# Patient Record
Sex: Female | Born: 1990 | Race: White | Hispanic: No | Marital: Single | Smoking: Current every day smoker
Health system: Southern US, Community
[De-identification: ages and names within clinical notes are randomized; demographics above are authoritative.]

## PROBLEM LIST (undated history)

## (undated) DIAGNOSIS — J45909 Unspecified asthma, uncomplicated: Secondary | ICD-10-CM

## (undated) HISTORY — PX: TONSILLECTOMY: SUR1361

## (undated) HISTORY — DX: Unspecified asthma, uncomplicated: J45.909

---
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
## 2010-05-09 ENCOUNTER — Encounter: Payer: Self-pay | Admitting: Obstetrics and Gynecology

## 2011-10-29 ENCOUNTER — Telehealth (HOSPITAL_COMMUNITY): Payer: Self-pay | Admitting: *Deleted

## 2011-10-29 ENCOUNTER — Encounter (HOSPITAL_COMMUNITY): Payer: Self-pay | Admitting: *Deleted

## 2011-10-29 ENCOUNTER — Inpatient Hospital Stay (HOSPITAL_COMMUNITY)
Admission: AD | Admit: 2011-10-29 | Discharge: 2011-11-01 | DRG: 885 | Disposition: A | Discharge status: Home / Self Care | Payer: Medicaid Other | Source: Ambulatory Visit | Attending: Psychiatry | Admitting: Psychiatry

## 2011-10-29 DIAGNOSIS — F3130 Bipolar disorder, current episode depressed, mild or moderate severity, unspecified: Secondary | ICD-10-CM

## 2011-10-29 DIAGNOSIS — J45909 Unspecified asthma, uncomplicated: Secondary | ICD-10-CM | POA: Diagnosis present

## 2011-10-29 DIAGNOSIS — F1210 Cannabis abuse, uncomplicated: Secondary | ICD-10-CM | POA: Diagnosis present

## 2011-10-29 DIAGNOSIS — F339 Major depressive disorder, recurrent, unspecified: Principal | ICD-10-CM | POA: Diagnosis present

## 2011-10-29 DIAGNOSIS — F609 Personality disorder, unspecified: Secondary | ICD-10-CM | POA: Diagnosis present

## 2011-10-29 DIAGNOSIS — F17200 Nicotine dependence, unspecified, uncomplicated: Secondary | ICD-10-CM | POA: Diagnosis present

## 2011-10-29 DIAGNOSIS — F6089 Other specific personality disorders: Secondary | ICD-10-CM | POA: Diagnosis present

## 2011-10-29 MED ORDER — NICOTINE 21 MG/24HR TD PT24
21.0000 mg | MEDICATED_PATCH | Freq: Every day | TRANSDERMAL | Status: DC
  Administered 2011-10-29 – 2011-10-31 (×2): 21 mg via TRANSDERMAL
  Filled 2011-10-29 (×7): qty 1

## 2011-10-29 MED ORDER — ACETAMINOPHEN 325 MG PO TABS
650.0000 mg | ORAL_TABLET | Freq: Four times a day (QID) | ORAL | Status: DC | PRN
  Administered 2011-10-31: 650 mg via ORAL

## 2011-10-29 MED ORDER — MAGNESIUM HYDROXIDE 400 MG/5ML PO SUSP: 30.0000 mL | Freq: Every day | ORAL | Status: DC | PRN

## 2011-10-29 MED ORDER — ALUM & MAG HYDROXIDE-SIMETH 200-200-20 MG/5ML PO SUSP: 30.0000 mL | ORAL | Status: DC | PRN

## 2011-10-29 MED ORDER — QUETIAPINE FUMARATE 25 MG PO TABS
25.0000 mg | ORAL_TABLET | ORAL | Status: DC | PRN
  Administered 2011-10-29 – 2011-10-31 (×3): 25 mg via ORAL
  Filled 2011-10-29 (×3): qty 1

## 2011-10-29 NOTE — Tx Team (Signed)
Initial Interdisciplinary Treatment Plan  PATIENT STRENGTHS: (choose at least two) Average or above average intelligence Motivation for treatment/growth Supportive family/friends Work skills  PATIENT STRESSORS: Loss of father and 3 close friends *   PROBLEM LIST: Problem List/Patient Goals Date to be addressed Date deferred Reason deferred Estimated date of resolution  Suicide ideation  10-29-11           Hx of bipolar d/o ; depressed 10-29-11           Multiple losses  10-29-11                              DISCHARGE CRITERIA:  Improved stabilization in mood, thinking, and/or behavior Reduction of life-threatening or endangering symptoms to within safe limits  PRELIMINARY DISCHARGE PLAN: Attend aftercare/continuing care group Return to previous living arrangement Return to previous work or school arrangements  PATIENT/FAMIILY INVOLVEMENT: This treatment plan has been presented to and reviewed with the patient, Felicia Richardson, and/or family member, .  The patient and family have been given the opportunity to ask questions and make suggestions.  Valente David 10/29/2011, 5:12 PM

## 2011-10-29 NOTE — Progress Notes (Signed)
[facility] Group Notes:  (Counselor/Nursing/MHT/Case Management/Adjunct)  10/29/2011 11:40 PM  Type of Therapy:  Psychoeducational Skills  Participation Level:  Minimal  Participation Quality:  Resistant  Affect:  Depressed  Cognitive:  Appropriate  Insight:  Limited  Engagement in Group:  Limited  Engagement in Therapy:  Limited  Modes of Intervention:  Education  Summary of Progress/Problems:The pt. was tearful at times during group. She had very little to say except that she was sad. No goal was stated.    Hazle Coca S 10/29/2011, 11:40 PM

## 2011-10-29 NOTE — Progress Notes (Signed)
Patient ID: Felicia Richardson, female   DOB: 1990-12-18, 21 y.o.   MRN: [id number] 10-29-11 nursing admission note: pt came to [facility] involuntarily from [facility] with an admitting dx of bipolar d/o nos. She was referred to [facility] by [facility] under the ivc. Pt endorses worsening depression for the past couple years. She attributes this to the death of her father and three close friends. She was having suicidal thoughts with a plan to overdose. On admission she was able to contract for suicide verbally, and denies any hi/av. She had a suicide attempt in the past by overdosing on tylenol. This is her first hospitalization in a psych facility. She has been seen at [facility] on an outpt basis in the past. She is non compliant with her medications for the last 3 months. She is having custody issues with her child. She stated she has 2 children, both boys and is single. She admits to daily use of mj with a uds positive for mj. She stated she drinks etoh socially x2 monthly. She has a medical hx of asthma, x2  c-secs and tonsillectomy.  She has a negative preg test, negative rpr, cbc= wnl and cmet=wnl. She is a lesbian and been in a 8 month relationship.  Pt was escorted to the 500 hall and report was given to Heron Nay.   Emergency contact : Eliese salazar-mother at cell ph # [phone]
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
## 2011-10-30 LAB — TSH: TSH: 3.818 u[IU]/mL (ref 0.350–4.500)
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
## 2011-10-30 NOTE — Progress Notes (Signed)
[facility] Group Notes:  (Counselor/Nursing/MHT/Case Management/Adjunct)  10/30/2011 2:25 PM  Type of Therapy:  Psychoeducational Skills  Participation Level:  Active  Participation Quality:  Appropriate and Sharing  Affect:  Appropriate  Cognitive:  Appropriate  Insight:  Good  Engagement in Group:  Good  Engagement in Therapy:  Good  Modes of Intervention:  Education  Summary of Progress/Problems: STAFF PROVIDED PATIENT WITH A PASSAGE ENTITLED "ENDING THE CYCLE: START TODAY". THE PATIENT WAS INSTRUCTED TO READ THE PASSAGE. STAFF GENERATED QUESTIONS FOR THE PATIENT TO ANSWER  AFTER READING THE PASSAGE: WHAT DID YOU GET FROM THE PASSAGE AND WHAT WS ONE POSITIVE LESSON LEARNED FROM THE PASSAGE? [name] WAS ENGAGED IN THE GROUP AND DISCUSSED THAT SHE WANTS TO STOP SMOKING THC. PATIENT ALSO READ THE PASSAGE AND EXPLAINED WHAT IT MEANT TO HER. STAFF ENCOURAGED [name] TO THINK POSITIVE ABOUT HER SITUATION AND TO FOCUS ON THINGS THAT SHE CAN CHANGE NOW. PATIENT IS ENCOURAGED TO UTILIZE COPING SKILLS TO ASSIST HER WITH HER MENTAL ILLNESS AND OTHER ISSUES THAT SHE IS DEALING WITH.     [name] O 10/30/2011, 2:25 PM
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
## 2011-10-30 NOTE — H&P (Signed)
Psychiatric Admission Assessment Adult  Patient Identification:  Felicia Richardson Date of Evaluation:  10/30/2011 20yo SF  On IVC CC: Suicidal ideation that comes and goes  `UDS+THC ` History of Present Illness: Suicidal with a plan to overdose on her meds. Called her advocate who brought her to theED. She is still actively grieving the deaths of her father and 3 friends. This is the first year anniversary. Patient is impulsive has had assault charges in past and currently has communicating threats charges.  One week ago presented for eval & treatment of depression ancxietySI poor concentration energy sadness sleep changes mood swings.  Past Psychiatric History: Age 59 admitted to [facility] for  behaviors and overdosed age 72 on Tylenol  Substance Abuse History:  Social History:    reports that she has been smoking Cigarettes.  She has a 7 pack-year smoking history. She has never used smokeless tobacco. She reports that she uses illicit drugs (Marijuana). She reports that she does not drink alcohol. UDS+THC Family Psych History: Father was "Environmental education officer" but this was after a head injury   Past Medical History:     Past Medical History  Diagnosis Date  . Asthma        Past Surgical History  Procedure Date  . Tonsillectomy     Allergies: No Known Allergies  Current Medications:  Prior to Admission medications   Not on File    Mental Status Examination/Evaluation: Objective:  Appearance: Fairly Groomed  Psychomotor Activity:  Increased  Eye Contact::  Fair  Speech:  Pressured  Volume:  Increased  Mood: anxiously depressed    Affect:  Labile  Thought Process: clear rational goal oriented    Orientation:  Full  Thought Content:  No AVH/psychosis   Suicidal Thoughts:  Yes.  without intent/plan  Homicidal Thoughts:  No  Judgement:  Poor  Insight:  Lacking    DIAGNOSIS:    AXIS I Mood Disorder NOS, Substance Abuse and Substance Induced Mood Disorder  AXIS II Cluster B  Traits  AXIS III See medical history.  AXIS IV economic problems, educational problems, occupational problems, other psychosocial or environmental problems, problems related to legal system/crime and problems with primary support group  AXIS V 31-40 impairment in reality testing     Treatment Plan Summary: Admit for safety & stabilization  Patient opposed to meds  Agree with H&P from [facility]

## 2011-10-30 NOTE — Progress Notes (Addendum)
Psychoeducational Group  10/30/2011  1015  Group Topic  :   Healthy Support System -  The focus of this group is to assist patient to identify their support systems .  Group Participation   :   Active  Participation Level:    Active  Participation Quality   : Appropriate, Sharing and supportive  Affect:      Appropriate and Flat  Cognitive:   Appropriate  Insight:     Minimal       PDuke RN [facility]

## 2011-10-30 NOTE — Progress Notes (Signed)
Patient ID: Felicia Richardson, female   DOB: [birth year], 21 y.o.   MRN: [id number]  Pt. attended and participated in aftercare planning group. Pt. accepted information on suicide prevention, warning signs to look for with suicide and crisis line numbers to use. The pt. agreed to call crisis line numbers if having warning signs or having thoughts of suicide. Pt. listed their current anxiety level as 0 and their current depression level as 0. Pt. did not have any questions or concerns regarding aftercare planning during group.

## 2011-10-30 NOTE — Progress Notes (Signed)
Patient ID: Felicia Richardson, female   DOB: Jan 21, 1991, 20 y.o.   MRN: [id number]   [facility] Group Notes:  (Counselor/Nursing/MHT/Case Management/Adjunct)  10/30/2011 1:15 PM  Type of Therapy:  Group Therapy, Dance/Movement Therapy   Participation Level:  Active  Participation Quality:  Monopolizing  Affect:  Appropriate  Cognitive:  Appropriate  Insight:  Limited  Engagement in Group:  Good  Engagement in Therapy:  Good  Modes of Intervention:  Clarification, Problem-solving, Role-play, Socialization and Support  Summary of Progress/Problems: Therapist and group members discussed what we need in a healthy support system and things we do not need. Therapist modeled a positive support and a negative support and group members discussed the differences. Pt. shared that she was feeling good today. Pt. was seeking attention from the group by bringing up an activity that she wanted to do several times during group. Pt. does not want to continue with medication and stated, "I feel like my mind is strong enough to use coping skills and hobbies instead of medication."      [name] 10/30/2011. 3:09 PM

## 2011-10-30 NOTE — Progress Notes (Signed)
Patient ID: Felicia Richardson, female   DOB: [birth year], 21 y.o.   MRN: [id number]  Problem: Suicidal, Depression  D: Pt pleasant and cooperative, out in milieu interacting well with peers. Pt with good hygiene and appetite  A: Monitor q 15 minutes for safety, encourage staff/peer interaction and group participation. Administer medications as ordered by MD. Encourage pt to share feelings.  R: Pt continued to socialize with peers, styling other's hair and laughing with peers. Pt attended group and became tearful, endorsing depression at times. Pt denies SI at this time and is compliant with medications. No inappropriate behaviors noted.

## 2011-10-31 DIAGNOSIS — F319 Bipolar disorder, unspecified: Secondary | ICD-10-CM

## 2011-10-31 NOTE — Progress Notes (Signed)
Currently resting quietly in bed with eyes closed. Respirations are even and unlabored. No acute distress or discomfort noted. Safety has been maintained with Q15 minute observation. Will continue current POC. 

## 2011-10-31 NOTE — Tx Team (Signed)
Interdisciplinary Treatment Plan Update (Adult)  Date:  10/31/2011  Time Reviewed:  10:33 AM   Progress in Treatment: Attending groups: Yes Participating in groups:  Yes Taking medication as prescribed: Yes Tolerating medication:  Yes Family/Significant other contact made:  Counselor will assess for appropriate contact Patient understands diagnosis:  Yes Discussing patient identified problems/goals with staff:  Yes Medical problems stabilized or resolved:  Yes Denies suicidal/homicidal ideation: Yes Issues/concerns per patient self-inventory:  None identified Other: N/A  New problem(s) identified: None Identified  Reason for Continuation of Hospitalization: Anxiety Depression Medication stabilization  Interventions implemented related to continuation of hospitalization: mood stabilization, medication monitoring and adjustment, group therapy and psycho education, safety checks q 15 mins  Additional comments: N/A  Estimated length of stay: 3-5 days  Discharge Plan:SW will assess for appropriate referrals.    New goal(s): N/A  Review of initial/current patient goals per problem list:    1.  Goal(s): Reduce depressive symptoms  Met:  No  Target date: by discharge  As evidenced by: Reducing depression from a 10 to a 3 as reported by pt.   2.  Goal (s): Reduce/Eliminate suicidal ideation  Met:  No  Target date: by discharge  As evidenced by: pt reporting no SI.    3.  Goal(s): Reduce anxiety symptoms  Met:  No  Target date: by discharge  As evidenced by: Reduce anxiety from a 10 to a 3 as reported by pt.    Attendees: Patient:  Felicia Richardson 10/31/2011 10:37 AM   Family:     Physician:     Nursing:   Chinita Greenland, RN 10/31/2011 10:36 AM   Case Manager:  Reyes Ivan, LCSWA 10/31/2011  10:33 AM   Counselor:  Angus Palms, LCSW 10/31/2011  10:33 AM   Other:  Juline Patch, LCSW 10/31/2011  10:33 AM   Other:  Serena Colonel, NP 10/31/2011 10:36 AM   Other:   Corinne Ports, Psyc intern 10/31/2011 10:36 AM   Other:      Scribe for Treatment Team:   Carmina Miller, 10/31/2011 , 10:33 AM
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
## 2011-10-31 NOTE — Progress Notes (Signed)
D:  Patient reports depression as 0/10 and hopelessness 0/10.  She states that she is going to groups and has no suicidal thoughts and would like to be discharged.  She became upset that no one would discharge her stating that no one cared about her kids. She was resistant to hearing any explanations for continuing to stay here.   A:   Emotional support provided.  Safety checks q 15 minutes.   R:  Patient continues to be upset with staff due to not being discharged.  Refuses all medications, stating that she wants to handle her life through coping mechanisms.

## 2011-10-31 NOTE — Progress Notes (Signed)
[facility] Adult Inpatient Family/Significant Other Suicide Prevention Education  Suicide Prevention Education:  Contact Attempts: Elandra Powell, mother, [phone]) has been identified by the patient as the family member/significant other who will aid the patient in the event of a mental health crisis.  With written consent from the patient, two attempts were made to provide suicide prevention education, prior to and/or following the patient's discharge.  We were unsuccessful in providing suicide prevention education.  A suicide education pamphlet was given to the patient to share with family/significant other.  Date and time of first attempt:10/31/2011   11:13am  Date and time of second attempt:10/31/2011   3:52 PM    Counselor also attempted to contact Alix's girlfriend, Nadene Rubins, at [phone] on 10/31/2011 At 3:53 PM and was unable to contact her.   Billie Lade 10/31/2011, 3:51 PM

## 2011-10-31 NOTE — Progress Notes (Signed)
[facility] Group Notes: (Counselor/Nursing/MHT/Case Management/Adjunct) 10/31/2011   @  1:15-2:30pm Overcoming Obstacles to Wellness   Type of Therapy:  Group Therapy  Participation Level:  Active  Participation Quality:  Intrusive, Sharing, Monopolizing, Resistant, Inappropriate  Affect:  Appropriate  Cognitive:  Appropriate  Insight:  Limited - statements were insightful into her situation but lacked insight into how she was impacting others  Engagement in Group: Limited  Engagement in Therapy:  Limited  Modes of Intervention:  Support and Exploration  Summary of Progress/Problems: Glorious made several insightful statements during group, talking about how one cannot forget past pains and mistakes but can move past them. However, she became a bit pushy, interrupting others to tell them what they should or have to do. She became frustrated when counselor pointed out to her that others may be in a very different space and although she was trying to help, telling them what to do may cause them to shut down or seem to invalidate their experiences. Mona left the group, then re-entered during relaxation exercise. She stayed only long enough to make an inappropriate statement toward counselor then left,laughing.   Angus Palms, LCSW 10/31/2011  3:30 PM
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
## 2011-10-31 NOTE — Progress Notes (Signed)
Psychoeducational Group Note  Date:  10/31/2011 Time:  1100  Group Topic/Focus:  Self Care:   The focus of this group is to help patients understand the importance of self-care in order to improve or restore emotional, physical, spiritual, interpersonal, and financial health.  Participation Level:  Active  Participation Quality:  Appropriate  Affect:  Excited  Cognitive:  Alert  Insight:  Good  Engagement in Group:  Good  Additional Comments:  none  [name] 10/31/2011, 6:33 PM

## 2011-10-31 NOTE — Progress Notes (Signed)
[facility] Group Notes:  (Counselor/Nursing/MHT/Case Management/Adjunct)  10/31/2011 12:24 AM  Type of Therapy:  Psychoeducational Skills  Participation Level:  Active  Participation Quality:  Monopolizing  Affect:  Anxious  Cognitive:  Appropriate  Insight:  Limited  Engagement in Group:  Limited  Engagement in Therapy:  Limited  Modes of Intervention:  Education  Summary of Progress/Problems: The pt. Verbalized that she had a good day overall as she took advantage of going outside. She did mention that she wants to be discharged tomorrow since she doesn't have reliable childcare. Pt. Had to be redirected for talking out of turn during the group.   Felicia Richardson S 10/31/2011, 12:24 AM

## 2011-11-01 DIAGNOSIS — F6089 Other specific personality disorders: Secondary | ICD-10-CM | POA: Diagnosis present

## 2011-11-01 DIAGNOSIS — F609 Personality disorder, unspecified: Secondary | ICD-10-CM

## 2011-11-01 DIAGNOSIS — F339 Major depressive disorder, recurrent, unspecified: Secondary | ICD-10-CM | POA: Diagnosis present

## 2011-11-01 NOTE — Progress Notes (Signed)
Currently resting quietly in bed with eyes closed. Respirations are even and unlabored. No acute distress or discomfort noted. Safety has been maintained with Q15 minute observation. Will continue current POC. 

## 2011-11-01 NOTE — Tx Team (Signed)
Interdisciplinary Treatment Plan Update (Adult)  Date:  11/01/2011  Time Reviewed:  10:12 AM   Progress in Treatment: Attending groups: Yes Participating in groups:  Yes Taking medication as prescribed: Yes Tolerating medication:  Yes Family/Significant other contact made:  Counselor will attempt to contact today.  Patient understands diagnosis:  Yes Discussing patient identified problems/goals with staff:  Yes Medical problems stabilized or resolved:  Yes Denies suicidal/homicidal ideation: Yes Issues/concerns per patient self-inventory:  None identified Other: N/A  New problem(s) identified: None Identified  Reason for Continuation of Hospitalization: Stable to d/c   Interventions implemented related to continuation of hospitalization: Stable to d/c  Additional comments: N/A  Estimated length of stay: D/C today  Discharge Plan: Pt will follow up with [facility] for medication management and therapy.   New goal(s): N/A  Review of initial/current patient goals per problem list:    1.  Goal(s): Reduce depressive symptoms  Met:  Yes  Target date: by discharge  As evidenced by: Reducing depression from a 10 to a 3 as reported by pt. Pt rates at a 0 today.   2.  Goal (s): Reduce/Eliminate suicidal ideation  Met:  Yes  Target date: by discharge  As evidenced by: pt denies SI.      3.  Goal(s): Reduce anxiety symptoms  Met:  Yes  Target date: by discharge  As evidenced by: Reduce anxiety from a 10 to a 3 as reported by pt. Pt rates at a 3 today.     Attendees: Patient:  Felicia Richardson 11/01/2011 10:16 AM   Family:     Physician:    Nursing:   Neill Loft, RN 11/01/2011 10:18 AM   Case Manager:  Reyes Ivan, LCSWA 11/01/2011  10:12 AM   Counselor:  Angus Palms, LCSW 11/01/2011  10:12 AM   Other:  Juline Patch, LCSW 11/01/2011  10:12 AM   Other: Barrie Folk, RN 11/01/2011 10:18 AM   Other:  Carney Living, RN 11/01/2011 10:18 AM     Other:      Scribe for Treatment Team:   Carmina Miller, 11/01/2011 , 10:12 AM

## 2011-11-01 NOTE — Progress Notes (Signed)
[facility] Adult Inpatient Family/Significant Other Suicide Prevention Education  Suicide Prevention Education:  Education Completed; Felicia Richardson, mother, (face to face) has been identified by the patient as the family member/significant other with whom the patient will be residing, and identified as the person(s) who will aid the patient in the event of a mental health crisis (suicidal ideations/suicide attempt).  With written consent from the patient, the family member/significant other has been provided the following suicide prevention education, prior to the and/or following the discharge of the patient.  The suicide prevention education provided includes the following:  Suicide risk factors  Suicide prevention and interventions  National Suicide Hotline telephone number  [facility] assessment telephone number  [facility] Emergency Assistance 911  [facility] and/or Residential Mobile Crisis Unit telephone number  Request made of family/significant other to:  Remove weapons (e.g., guns, rifles, knives), all items previously/currently identified as safety concern.    Remove drugs/medications (over-the-counter, prescriptions, illicit drugs), all items previously/currently identified as a safety concern.  Felicia Richardson reported that she has no concerns about her daughter being discharge. She denied patient having access to guns and verbalized understanding of suicide prevention information. Felicia Richardson had no further questions about patient's inpatient or follow up care.  Felicia Richardson 11/01/2011, 12:59 PM
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
## 2011-11-01 NOTE — Progress Notes (Signed)
[facility] Case Management Discharge Plan:  Will you be returning to the same living situation after discharge: Yes,  return to own home At discharge, do you have transportation home?:Yes,  mom will pick pt up Do you have the ability to pay for your medications:Yes,  access to meds  Release of information consent forms completed and in the chart;  Patient's signature needed at discharge.  Patient to Follow up at:  Follow-up Information    Follow up with [facility] on 11/03/2011. (Appointment scheduled at 3:30 pm)    Contact information:   [address] [place], [place] [address] [phone]         Patient denies SI/HI:   Yes,  denies SI/HI    Safety Planning and Suicide Prevention discussed:  Yes,  discussed with pt  Barrier to discharge identified:No.  Summary and Recommendations: Pt attended discharge planning group and actively participated in group.  SW provided pt with today's workbook.  Pt presents with calm mood and affect.  Pt rates depression at a 0 and anxiety at a 3 today.  Pt denies SI.  Pt reports feeling stable to d/c today.  No recommendations from SW.  No further needs voiced by pt.  Pt stable to discharge.     Carmina Miller 11/01/2011, 10:19 AM
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
## 2011-11-01 NOTE — Progress Notes (Signed)
Pt d/c from hospital with family members. All items returned. D/C instructions given. Pt denies si and hi.
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
## 2011-11-03 NOTE — Progress Notes (Signed)
Patient Discharge Instructions:  After Visit Summary (AVS):   Faxed to:  11/02/2011 Psychiatric Admission Assessment Note:   Faxed to:  11/02/2011 Suicide Risk Assessment - Discharge Assessment:   Faxed to:  11/02/2011 Faxed/Sent to the Next Level Care provider:  11/02/2011  Faxed to [facility] @ [phone]  Wandra Scot, 11/03/2011, 12:19 PM
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
## 2016-07-04 ENCOUNTER — Encounter (HOSPITAL_COMMUNITY): Payer: Self-pay | Admitting: *Deleted

## 2016-07-04 ENCOUNTER — Emergency Department (HOSPITAL_COMMUNITY)

## 2016-07-04 ENCOUNTER — Emergency Department (HOSPITAL_COMMUNITY)
Admission: EM | Admit: 2016-07-04 | Discharge: 2016-07-05 | Disposition: A | Discharge status: Home / Self Care | Attending: Dermatology | Admitting: Dermatology

## 2016-07-04 DIAGNOSIS — F17210 Nicotine dependence, cigarettes, uncomplicated: Secondary | ICD-10-CM | POA: Insufficient documentation

## 2016-07-04 DIAGNOSIS — R0602 Shortness of breath: Secondary | ICD-10-CM | POA: Diagnosis not present

## 2016-07-04 DIAGNOSIS — R000 Tachycardia, unspecified: Secondary | ICD-10-CM | POA: Insufficient documentation

## 2016-07-04 DIAGNOSIS — M549 Dorsalgia, unspecified: Secondary | ICD-10-CM | POA: Insufficient documentation

## 2016-07-04 DIAGNOSIS — Z5321 Procedure and treatment not carried out due to patient leaving prior to being seen by health care provider: Secondary | ICD-10-CM | POA: Insufficient documentation

## 2016-07-04 LAB — URINALYSIS, ROUTINE W REFLEX MICROSCOPIC
BILIRUBIN URINE: NEGATIVE
Glucose, UA: NEGATIVE mg/dL
Hgb urine dipstick: NEGATIVE
KETONES UR: NEGATIVE mg/dL
LEUKOCYTES UA: NEGATIVE
NITRITE: NEGATIVE
PH: 5 (ref 5.0–8.0)
Protein, ur: NEGATIVE mg/dL
Specific Gravity, Urine: 1.03 (ref 1.005–1.030)

## 2016-07-04 LAB — BASIC METABOLIC PANEL
Anion gap: 9 (ref 5–15)
BUN: 13 mg/dL (ref 6–20)
CALCIUM: 8.3 mg/dL — AB (ref 8.9–10.3)
CO2: 25 mmol/L (ref 22–32)
CREATININE: 0.88 mg/dL (ref 0.44–1.00)
Chloride: 105 mmol/L (ref 101–111)
GFR calc non Af Amer: >60 mL/min (ref >60)
Glucose, Bld: 118 mg/dL — ABNORMAL HIGH (ref 65–99)
Potassium: 4 mmol/L (ref 3.5–5.1)
SODIUM: 139 mmol/L (ref 135–145)

## 2016-07-04 LAB — BRAIN NATRIURETIC PEPTIDE: B NATRIURETIC PEPTIDE 5: 126 pg/mL — AB (ref 0.0–100.0)

## 2016-07-04 LAB — CBC
HCT: 44.9 % (ref 36.0–46.0)
Hemoglobin: 15.1 g/dL — ABNORMAL HIGH (ref 12.0–15.0)
MCH: 29.5 pg (ref 26.0–34.0)
MCHC: 33.6 g/dL (ref 30.0–36.0)
MCV: 87.7 fL (ref 78.0–100.0)
PLATELETS: 207 10*3/uL (ref 150–400)
RBC: 5.12 MIL/uL — AB (ref 3.87–5.11)
RDW: 12.6 % (ref 11.5–15.5)
WBC: 14.1 10*3/uL — ABNORMAL HIGH (ref 4.0–10.5)

## 2016-07-04 LAB — I-STAT TROPONIN, ED: Troponin i, poc: 0 ng/mL (ref 0.00–0.08)

## 2016-07-04 LAB — POC URINE PREG, ED: PREG TEST UR: NEGATIVE

## 2016-07-04 NOTE — ED Triage Notes (Signed)
Pt is here for pain in lower back x a couple of weeks.  Pt denies any trauma associated with this.  No pain or burning with urination.  Pt HR is 120's-130 in triage. Pt states that Felicia Richardson frequently has high HR (plasma center sometimes doesn't let her sell plasma due to to this). Pt gave plasma today.

## 2018-05-05 IMAGING — DX DG CHEST 2V
2 series · 2 of 2 positions shown · non-contrast
Comparison: Chest radiograph dated 04/11/2015

CLINICAL DATA: 25-year-old female with shortness of breath and
tachycardia.

EXAM:
CHEST  2 VIEW

[chest pa]
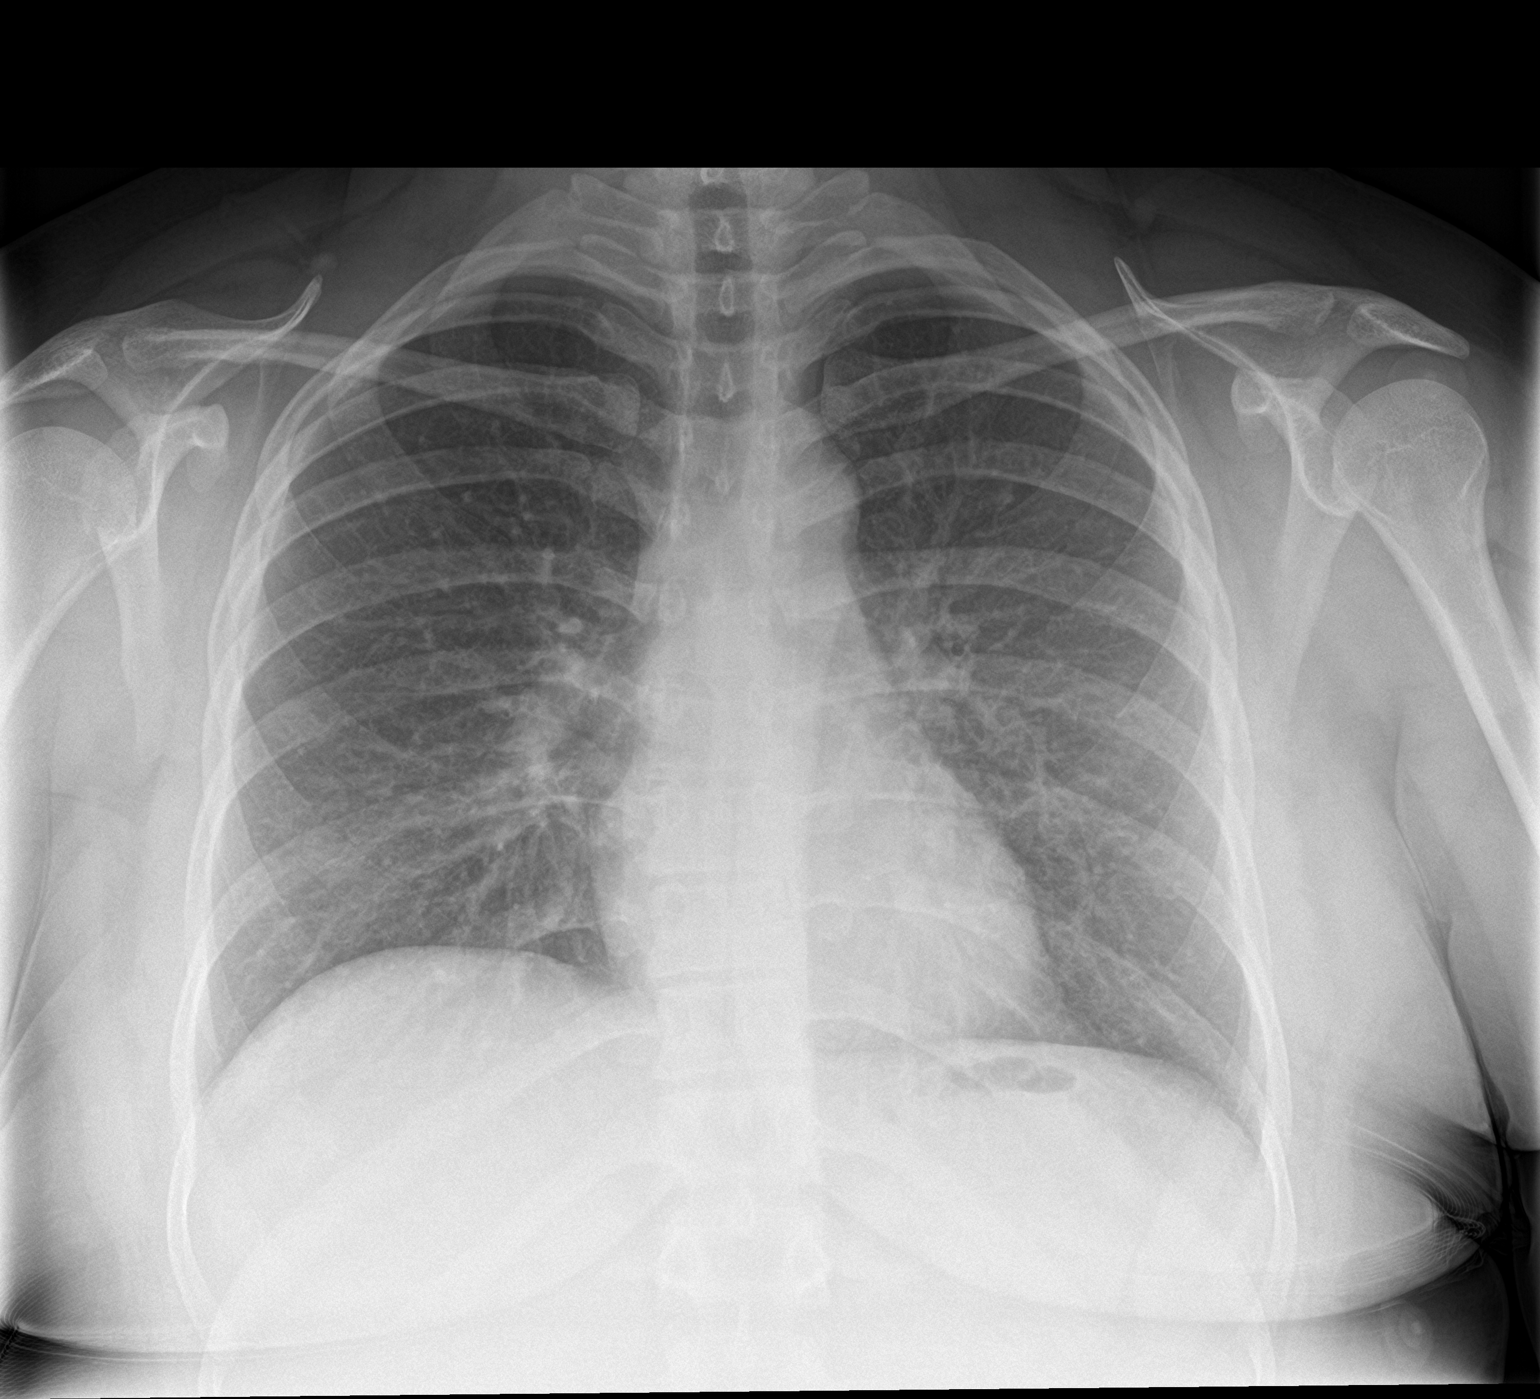

[chest lat]
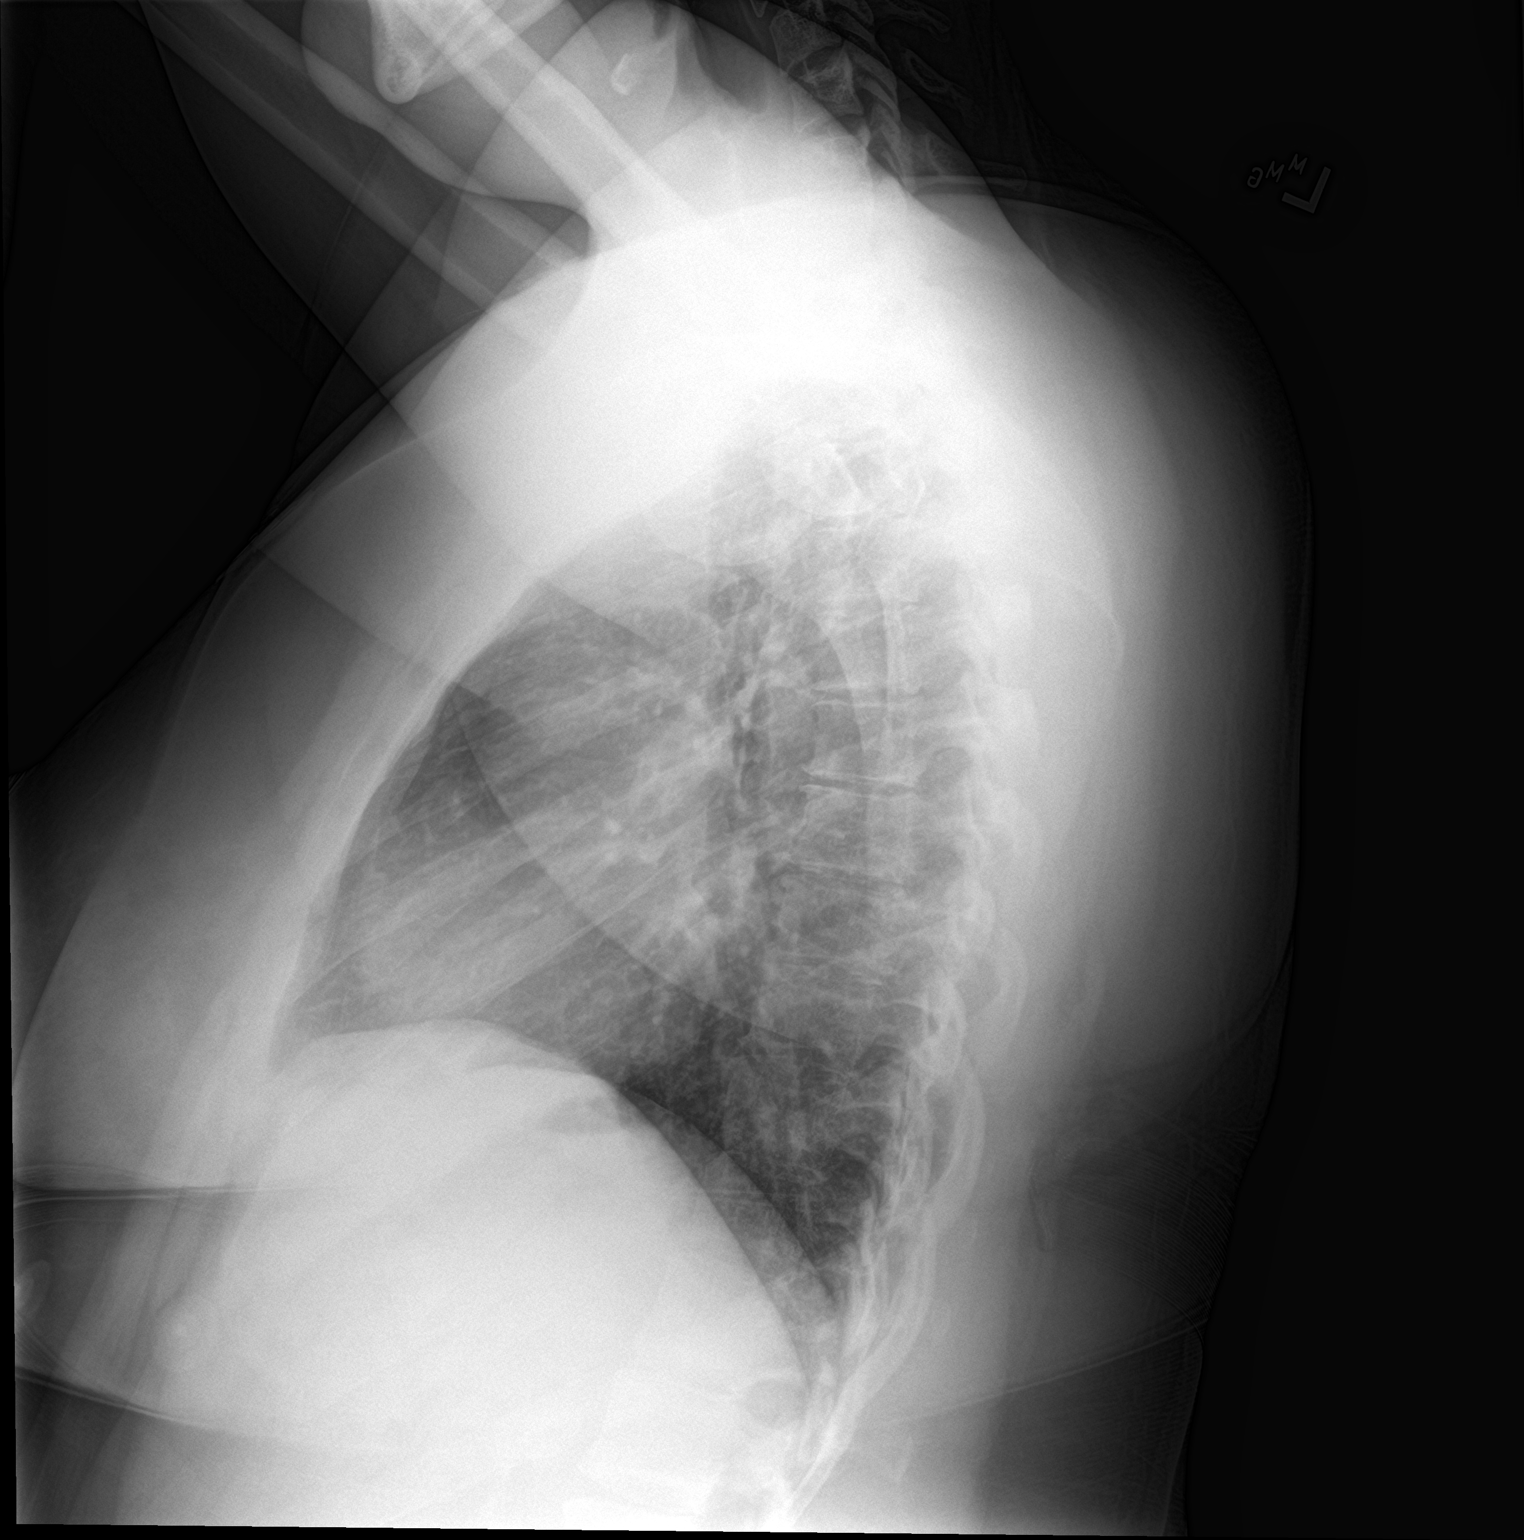

[2 of 2 positions shown; findings below may reference images not displayed]

FINDINGS: Mild increased interstitial prominence may be related to atelectatic
changes. Viral infection is not excluded. Clinical correlation is
recommended. There is no focal consolidation, pleural effusion, or
pneumothorax. The cardiac silhouette is within normal limits. No
acute osseous pathology.
IMPRESSION: No focal consolidation. A viral infection is not entirely excluded.
Clinical correlation is recommended.

## 2022-06-15 ENCOUNTER — Other Ambulatory Visit: Payer: Self-pay

## 2022-06-15 ENCOUNTER — Emergency Department
Admission: EM | Admit: 2022-06-15 | Discharge: 2022-06-15 | Disposition: A | Discharge status: Home / Self Care | Payer: Medicaid Other | Attending: Emergency Medicine | Admitting: Emergency Medicine

## 2022-06-15 DIAGNOSIS — R059 Cough, unspecified: Secondary | ICD-10-CM | POA: Diagnosis present

## 2022-06-15 DIAGNOSIS — J101 Influenza due to other identified influenza virus with other respiratory manifestations: Secondary | ICD-10-CM

## 2022-06-15 DIAGNOSIS — Z1152 Encounter for screening for COVID-19: Secondary | ICD-10-CM | POA: Diagnosis not present

## 2022-06-15 LAB — POC URINE PREG, ED: Preg Test, Ur: NEGATIVE

## 2022-06-15 LAB — RESP PANEL BY RT-PCR (RSV, FLU A&B, COVID)  RVPGX2
Influenza A by PCR: POSITIVE — AB
Influenza B by PCR: NEGATIVE
Resp Syncytial Virus by PCR: NEGATIVE
SARS Coronavirus 2 by RT PCR: NEGATIVE

## 2022-06-15 MED ORDER — ALBUTEROL SULFATE (2.5 MG/3ML) 0.083% IN NEBU
2.5000 mg | INHALATION_SOLUTION | Freq: Once | RESPIRATORY_TRACT | Status: AC
  Administered 2022-06-15: 2.5 mg via RESPIRATORY_TRACT
  Filled 2022-06-15: qty 3

## 2022-06-15 MED ORDER — ALBUTEROL SULFATE HFA 108 (90 BASE) MCG/ACT IN AERS: 2.0000 | INHALATION_SPRAY | Freq: Four times a day (QID) | RESPIRATORY_TRACT | 0 refills | Status: AC | PRN

## 2022-06-15 MED ORDER — ACETAMINOPHEN 325 MG PO TABS
650.0000 mg | ORAL_TABLET | Freq: Once | ORAL | Status: AC
  Administered 2022-06-15: 650 mg via ORAL
  Filled 2022-06-15: qty 2

## 2022-06-15 MED ORDER — OSELTAMIVIR PHOSPHATE 75 MG PO CAPS: 75.0000 mg | ORAL_CAPSULE | Freq: Two times a day (BID) | ORAL | 0 refills | Status: AC

## 2022-06-15 NOTE — ED Provider Notes (Signed)
[facility] Provider Note    Event Date/Time   First MD Initiated Contact with Patient 06/15/22 1224     (approximate)   History   Chills   HPI  Felicia Richardson is a 32 y.o. female who presents today for evaluation of cough, body aches, runny nose, and wheezing for the past 2 days.  She reports that she was exposed to somebody with COVID at work.  She has not had a temperature.  She reports that she took ibuprofen 800 mg just prior to arrival.  She is also requesting a pregnancy test.  Patient Active Problem List   Diagnosis Date Noted   Major depressive disorder, recurrent episode ([place]) 11/01/2011   Cluster B personality disorder ([place]) 11/01/2011          Physical Exam   Triage Vital Signs: ED Triage Vitals  Enc Vitals Group     BP 06/15/22 1155 109/88     Pulse Rate 06/15/22 1153 (!) 111     Resp 06/15/22 1153 18     Temp 06/15/22 1153 98.3 F (36.8 C)     Temp Source 06/15/22 1153 Oral     SpO2 06/15/22 1153 100 %     Weight 06/15/22 1153 221 lb 12.5 oz (100.6 kg)     Height 06/15/22 1153 '5\' 1"'$  (1.549 m)     Head Circumference --      Peak Flow --      Pain Score 06/15/22 1153 9     Pain Loc --      Pain Edu? --      Excl. in [place]? --     Most recent vital signs: Vitals:   06/15/22 1153 06/15/22 1155  BP:  109/88  Pulse: (!) 111   Resp: 18   Temp: 98.3 F (36.8 C)   SpO2: 100%     Physical Exam Vitals and nursing note reviewed.  Constitutional:      General: Awake and alert. No acute distress.    Appearance: Normal appearance. The patient is obese.  HENT:     Head: Normocephalic and atraumatic.     Mouth: Mucous membranes are moist.  Eyes:     General: PERRL. Normal EOMs        Right eye: No discharge.        Left eye: No discharge.     Conjunctiva/sclera: Conjunctivae normal.  Cardiovascular:     Rate and Rhythm: Normal rate and regular rhythm.     Pulses: Normal pulses.  Pulmonary:     Effort: Pulmonary effort  is normal. No respiratory distress.     Breath sounds: Normal breath sounds.  Abdominal:     Abdomen is soft. There is no abdominal tenderness. No rebound or guarding. No distention. Musculoskeletal:        General: No swelling. Normal range of motion.     Cervical back: Normal range of motion and neck supple.  Skin:    General: Skin is warm and dry.     Capillary Refill: Capillary refill takes less than 2 seconds.     Findings: No rash.  Neurological:     Mental Status: The patient is awake and alert.      ED Results / Procedures / Treatments   Labs (all labs ordered are listed, but only abnormal results are displayed) Labs Reviewed  RESP PANEL BY RT-PCR (RSV, FLU A&B, COVID)  [id number] - Abnormal; Notable for the following components:  Result Value   Influenza A by PCR POSITIVE (*)    All other components within normal limits  POC URINE PREG, ED     EKG     RADIOLOGY     PROCEDURES:  Critical Care performed:   Procedures   MEDICATIONS ORDERED IN ED: Medications  acetaminophen (TYLENOL) tablet 650 mg (650 mg Oral Given 06/15/22 1235)  albuterol (PROVENTIL) (2.5 MG/3ML) 0.083% nebulizer solution 2.5 mg (2.5 mg Nebulization Given 06/15/22 1236)     IMPRESSION / MDM / ASSESSMENT AND PLAN / ED COURSE  I reviewed the triage vital signs and the nursing notes.   Differential diagnosis includes, but is not limited to, COVID, influenza, pneumonia, bronchitis.  Patient is awake and alert, mildly tachycardic on arrival though normotensive, afebrile, and has a normal oxygen saturation of 100% on room air.  Lungs are clear to auscultation bilaterally, no increased work of breathing, no fever to suggest pneumonia.  She has requested a nebulizer treatment which was provided with improvement of her symptoms.  She was also treated with Tylenol.  She did request a pregnancy test given that she had a "light period" earlier this month and this was negative.  Her COVID/flu/RSV  test was positive for influenza.  We discussed this diagnosis and that she is contagious to others.  She requested a prescription for Tamiflu which was provided.  We discussed symptomatic management and return precautions.  Patient or stands and agrees with plan.  She was discharged in stable condition.  She was given a work note per her request.   Patient's presentation is most consistent with acute complicated illness / injury requiring diagnostic workup.     FINAL CLINICAL IMPRESSION(S) / ED DIAGNOSES   Final diagnoses:  Influenza A     Rx / DC Orders   ED Discharge Orders          Ordered    oseltamivir (TAMIFLU) 75 MG capsule  2 times daily        06/15/22 1411    albuterol (VENTOLIN HFA) 108 (90 Base) MCG/ACT inhaler  Every 6 hours PRN        06/15/22 1414             Note:  This document was prepared using Dragon voice recognition software and may include unintentional dictation errors.   Felicia Old, PA-C 06/15/22 1801    Vanessa [place], MD 06/16/22 [phone]

## 2022-06-15 NOTE — ED Notes (Signed)
Pt requested contact info for the pt relations. Pt was very dissatisfied it seems.

## 2022-06-15 NOTE — Discharge Instructions (Signed)
You may take the Tamiflu as prescribed to help with your flu.  Please return for any new, worsening, or change in symptoms or other concerns.

## 2022-06-15 NOTE — ED Triage Notes (Signed)
Pt here with chills, cough, and body aches since yesterday. Pt states yesterday she had a fever as well.
